# Patient Record
Sex: Female | Born: 1940 | Race: White | Hispanic: No | Marital: Married | Smoking: Never smoker
Health system: Southern US, Community
[De-identification: ages and names within clinical notes are randomized; demographics above are authoritative.]

## PROBLEM LIST (undated history)

## (undated) DIAGNOSIS — I4891 Unspecified atrial fibrillation: Secondary | ICD-10-CM

## (undated) DIAGNOSIS — E785 Hyperlipidemia, unspecified: Secondary | ICD-10-CM

## (undated) DIAGNOSIS — I2510 Atherosclerotic heart disease of native coronary artery without angina pectoris: Secondary | ICD-10-CM

## (undated) DIAGNOSIS — I10 Essential (primary) hypertension: Secondary | ICD-10-CM

## (undated) DIAGNOSIS — N183 Chronic kidney disease, stage 3 unspecified: Secondary | ICD-10-CM

## (undated) HISTORY — PX: COLONOSCOPY: SHX174

## (undated) HISTORY — PX: CORONARY STENT PLACEMENT: SHX1402

## (undated) HISTORY — PX: OTHER SURGICAL HISTORY: SHX169

---

## 2021-01-07 ENCOUNTER — Observation Stay
Admission: EM | Admit: 2021-01-07 | Discharge: 2021-01-08 | Disposition: A | Discharge status: Home / Self Care | Payer: Medicare Other | Attending: Internal Medicine | Admitting: Internal Medicine

## 2021-01-07 ENCOUNTER — Encounter: Payer: Self-pay | Admitting: Emergency Medicine

## 2021-01-07 ENCOUNTER — Encounter: Payer: Self-pay | Admitting: Internal Medicine

## 2021-01-07 ENCOUNTER — Emergency Department: Payer: Medicare Other

## 2021-01-07 DIAGNOSIS — I4821 Permanent atrial fibrillation: Secondary | ICD-10-CM | POA: Insufficient documentation

## 2021-01-07 DIAGNOSIS — Z7901 Long term (current) use of anticoagulants: Secondary | ICD-10-CM | POA: Diagnosis not present

## 2021-01-07 DIAGNOSIS — N1832 Chronic kidney disease, stage 3b: Secondary | ICD-10-CM | POA: Diagnosis not present

## 2021-01-07 DIAGNOSIS — I700 Atherosclerosis of aorta: Secondary | ICD-10-CM | POA: Insufficient documentation

## 2021-01-07 DIAGNOSIS — A419 Sepsis, unspecified organism: Principal | ICD-10-CM | POA: Diagnosis present

## 2021-01-07 DIAGNOSIS — K922 Gastrointestinal hemorrhage, unspecified: Secondary | ICD-10-CM

## 2021-01-07 DIAGNOSIS — K625 Hemorrhage of anus and rectum: Secondary | ICD-10-CM | POA: Diagnosis present

## 2021-01-07 DIAGNOSIS — Z79899 Other long term (current) drug therapy: Secondary | ICD-10-CM | POA: Insufficient documentation

## 2021-01-07 DIAGNOSIS — K529 Noninfective gastroenteritis and colitis, unspecified: Secondary | ICD-10-CM | POA: Diagnosis present

## 2021-01-07 DIAGNOSIS — I10 Essential (primary) hypertension: Secondary | ICD-10-CM | POA: Diagnosis present

## 2021-01-07 DIAGNOSIS — I2510 Atherosclerotic heart disease of native coronary artery without angina pectoris: Secondary | ICD-10-CM | POA: Diagnosis not present

## 2021-01-07 DIAGNOSIS — I129 Hypertensive chronic kidney disease with stage 1 through stage 4 chronic kidney disease, or unspecified chronic kidney disease: Secondary | ICD-10-CM | POA: Insufficient documentation

## 2021-01-07 DIAGNOSIS — I4891 Unspecified atrial fibrillation: Secondary | ICD-10-CM | POA: Diagnosis present

## 2021-01-07 DIAGNOSIS — E785 Hyperlipidemia, unspecified: Secondary | ICD-10-CM | POA: Diagnosis present

## 2021-01-07 DIAGNOSIS — Z20822 Contact with and (suspected) exposure to covid-19: Secondary | ICD-10-CM | POA: Insufficient documentation

## 2021-01-07 DIAGNOSIS — N183 Chronic kidney disease, stage 3 unspecified: Secondary | ICD-10-CM | POA: Diagnosis present

## 2021-01-07 HISTORY — DX: Chronic kidney disease, stage 3 unspecified: N18.30

## 2021-01-07 HISTORY — DX: Unspecified atrial fibrillation: I48.91

## 2021-01-07 HISTORY — DX: Essential (primary) hypertension: I10

## 2021-01-07 HISTORY — DX: Hyperlipidemia, unspecified: E78.5

## 2021-01-07 HISTORY — DX: Atherosclerotic heart disease of native coronary artery without angina pectoris: I25.10

## 2021-01-07 LAB — URINALYSIS, COMPLETE (UACMP) WITH MICROSCOPIC
Bilirubin Urine: NEGATIVE
Glucose, UA: NEGATIVE mg/dL
Ketones, ur: NEGATIVE mg/dL
Nitrite: NEGATIVE
Protein, ur: NEGATIVE mg/dL
Specific Gravity, Urine: 1.015 (ref 1.005–1.030)
pH: 5 (ref 5.0–8.0)

## 2021-01-07 LAB — CBC
HCT: 38.7 % (ref 36.0–46.0)
HCT: 38.7 % (ref 36.0–46.0)
HCT: 35.7 % — ABNORMAL LOW (ref 36.0–46.0)
HCT: 33.9 % — ABNORMAL LOW (ref 36.0–46.0)
Hemoglobin: 13.2 g/dL (ref 12.0–15.0)
Hemoglobin: 13.1 g/dL (ref 12.0–15.0)
Hemoglobin: 12 g/dL (ref 12.0–15.0)
Hemoglobin: 11.3 g/dL — ABNORMAL LOW (ref 12.0–15.0)
MCH: 32.6 pg (ref 26.0–34.0)
MCH: 32.4 pg (ref 26.0–34.0)
MCH: 32.3 pg (ref 26.0–34.0)
MCH: 32.4 pg (ref 26.0–34.0)
MCHC: 34.1 g/dL (ref 30.0–36.0)
MCHC: 33.9 g/dL (ref 30.0–36.0)
MCHC: 33.6 g/dL (ref 30.0–36.0)
MCHC: 33.3 g/dL (ref 30.0–36.0)
MCV: 95.6 fL (ref 80.0–100.0)
MCV: 95.8 fL (ref 80.0–100.0)
MCV: 96.2 fL (ref 80.0–100.0)
MCV: 97.1 fL (ref 80.0–100.0)
Platelets: 244 10*3/uL (ref 150–400)
Platelets: 261 10*3/uL (ref 150–400)
Platelets: 246 10*3/uL (ref 150–400)
Platelets: 208 10*3/uL (ref 150–400)
RBC: 4.05 MIL/uL (ref 3.87–5.11)
RBC: 4.04 MIL/uL (ref 3.87–5.11)
RBC: 3.71 MIL/uL — ABNORMAL LOW (ref 3.87–5.11)
RBC: 3.49 MIL/uL — ABNORMAL LOW (ref 3.87–5.11)
RDW: 13.1 % (ref 11.5–15.5)
RDW: 13.2 % (ref 11.5–15.5)
RDW: 13.1 % (ref 11.5–15.5)
RDW: 13.2 % (ref 11.5–15.5)
WBC: 16.6 10*3/uL — ABNORMAL HIGH (ref 4.0–10.5)
WBC: 17.6 10*3/uL — ABNORMAL HIGH (ref 4.0–10.5)
WBC: 13.8 10*3/uL — ABNORMAL HIGH (ref 4.0–10.5)
WBC: 12.1 10*3/uL — ABNORMAL HIGH (ref 4.0–10.5)
nRBC: 0 % (ref 0.0–0.2)
nRBC: 0 % (ref 0.0–0.2)
nRBC: 0 % (ref 0.0–0.2)
nRBC: 0 % (ref 0.0–0.2)

## 2021-01-07 LAB — COMPREHENSIVE METABOLIC PANEL
ALT: 13 U/L (ref 0–44)
AST: 25 U/L (ref 15–41)
Albumin: 4.1 g/dL (ref 3.5–5.0)
Alkaline Phosphatase: 82 U/L (ref 38–126)
Anion gap: 7 (ref 5–15)
BUN: 36 mg/dL — ABNORMAL HIGH (ref 8–23)
CO2: 23 mmol/L (ref 22–32)
Calcium: 9.1 mg/dL (ref 8.9–10.3)
Chloride: 107 mmol/L (ref 98–111)
Creatinine, Ser: 1.62 mg/dL — ABNORMAL HIGH (ref 0.44–1.00)
GFR, Estimated: 32 mL/min — ABNORMAL LOW (ref >60)
Glucose, Bld: 152 mg/dL — ABNORMAL HIGH (ref 70–99)
Potassium: 4.4 mmol/L (ref 3.5–5.1)
Sodium: 137 mmol/L (ref 135–145)
Total Bilirubin: 0.8 mg/dL (ref 0.3–1.2)
Total Protein: 7.1 g/dL (ref 6.5–8.1)

## 2021-01-07 LAB — RESP PANEL BY RT-PCR (FLU A&B, COVID) ARPGX2
Influenza A by PCR: NEGATIVE
Influenza B by PCR: NEGATIVE
SARS Coronavirus 2 by RT PCR: NEGATIVE

## 2021-01-07 LAB — PROCALCITONIN: Procalcitonin: 0.17 ng/mL

## 2021-01-07 LAB — BRAIN NATRIURETIC PEPTIDE: B Natriuretic Peptide: 154.1 pg/mL — ABNORMAL HIGH (ref 0.0–100.0)

## 2021-01-07 LAB — LIPASE, BLOOD: Lipase: 34 U/L (ref 11–51)

## 2021-01-07 LAB — PROTIME-INR
INR: 1.6 — ABNORMAL HIGH (ref 0.8–1.2)
Prothrombin Time: 19 seconds — ABNORMAL HIGH (ref 11.4–15.2)

## 2021-01-07 LAB — TYPE AND SCREEN
ABO/RH(D): A POS
Antibody Screen: NEGATIVE

## 2021-01-07 LAB — LACTIC ACID, PLASMA: Lactic Acid, Venous: 1.8 mmol/L (ref 0.5–1.9)

## 2021-01-07 LAB — APTT: aPTT: 29 seconds (ref 24–36)

## 2021-01-07 MED ORDER — DILTIAZEM HCL ER COATED BEADS 120 MG PO CP24
360.0000 mg | ORAL_CAPSULE | Freq: Every day | ORAL | Status: DC
  Administered 2021-01-07 – 2021-01-08 (×2): 360 mg via ORAL
  Filled 2021-01-07 (×2): qty 3

## 2021-01-07 MED ORDER — RISAQUAD PO CAPS
1.0000 | ORAL_CAPSULE | Freq: Every day | ORAL | Status: DC
  Administered 2021-01-07 – 2021-01-08 (×2): 1 via ORAL
  Filled 2021-01-07 (×2): qty 1

## 2021-01-07 MED ORDER — SODIUM CHLORIDE 0.9 % IV SOLN: 2.0000 g | Freq: Once | INTRAVENOUS | Status: DC

## 2021-01-07 MED ORDER — HYDRALAZINE HCL 20 MG/ML IJ SOLN: 5.0000 mg | INTRAMUSCULAR | Status: DC | PRN

## 2021-01-07 MED ORDER — ONDANSETRON HCL 4 MG/2ML IJ SOLN
4.0000 mg | Freq: Three times a day (TID) | INTRAMUSCULAR | Status: DC | PRN
  Administered 2021-01-07: 4 mg via INTRAVENOUS
  Filled 2021-01-07: qty 2

## 2021-01-07 MED ORDER — ACETAMINOPHEN 325 MG PO TABS: 650.0000 mg | ORAL_TABLET | Freq: Four times a day (QID) | ORAL | Status: DC | PRN

## 2021-01-07 MED ORDER — METOPROLOL SUCCINATE ER 25 MG PO TB24: 25.0000 mg | ORAL_TABLET | Freq: Every evening | ORAL | Status: DC

## 2021-01-07 MED ORDER — VITAMIN D 25 MCG (1000 UNIT) PO TABS
1000.0000 [IU] | ORAL_TABLET | Freq: Every day | ORAL | Status: DC
  Administered 2021-01-07 – 2021-01-08 (×2): 1000 [IU] via ORAL
  Filled 2021-01-07 (×2): qty 1

## 2021-01-07 MED ORDER — SODIUM CHLORIDE 0.9 % IV SOLN: INTRAVENOUS | Status: DC

## 2021-01-07 MED ORDER — PIPERACILLIN-TAZOBACTAM 3.375 G IVPB
3.3750 g | Freq: Three times a day (TID) | INTRAVENOUS | Status: DC
  Administered 2021-01-07 – 2021-01-08 (×3): 3.375 g via INTRAVENOUS
  Filled 2021-01-07 (×3): qty 50

## 2021-01-07 MED ORDER — MORPHINE SULFATE (PF) 2 MG/ML IV SOLN: 0.5000 mg | INTRAVENOUS | Status: DC | PRN

## 2021-01-07 MED ORDER — METRONIDAZOLE 500 MG/100ML IV SOLN: 500.0000 mg | Freq: Once | INTRAVENOUS | Status: DC

## 2021-01-07 MED ORDER — ATORVASTATIN CALCIUM 20 MG PO TABS
20.0000 mg | ORAL_TABLET | Freq: Every day | ORAL | Status: DC
  Administered 2021-01-07 – 2021-01-08 (×2): 20 mg via ORAL
  Filled 2021-01-07 (×2): qty 1

## 2021-01-07 MED ORDER — SODIUM CHLORIDE 0.9 % IV BOLUS
1000.0000 mL | Freq: Once | INTRAVENOUS | Status: AC
  Administered 2021-01-07: 1000 mL via INTRAVENOUS

## 2021-01-07 NOTE — Consult Note (Signed)
Pharmacy Antibiotic Note  Raven Cox is a 80 y.o. female admitted on 01/07/2021 with  acute colitis and sepsis  presenting to the ED with abdominal pain and rectal bleeding.  Pharmacy has been consulted for Zosyn dosing.  Plan: Zosyn 3.375g IV q8h (4 hour infusion).  Height: 5' 5.98" (167.6 cm) Weight: 95.3 kg (210 lb) IBW/kg (Calculated) : 59.26  Temp (24hrs), Avg:98.3 F (36.8 C), Min:98.3 F (36.8 C), Max:98.3 F (36.8 C)  Recent Labs  Lab 01/07/21 0458 01/07/21 0832 01/07/21 0941  WBC 16.6* 17.6*  --   CREATININE 1.62*  --   --   LATICACIDVEN  --   --  1.8    Estimated Creatinine Clearance: 31.2 mL/min (A) (by C-G formula based on SCr of 1.62 mg/dL (H)).    Not on File  Antimicrobials this admission: Zosyn 7/29 >>   Microbiology results: 7/29 BCx: pending   Thank you for allowing pharmacy to be a part of this patient's care.  Gerasimos Plotts A Mirriam Vadala 01/07/2021 11:10 AM

## 2021-01-07 NOTE — H&P (Signed)
History and Physical    Raven Cox PXT:[id number] DOB: 10-07-1940 DOA: 01/07/2021  Referring MD/NP/PA:   PCP: Pcp, No   Patient coming from:  The patient is coming from home.  At baseline, pt is independent for most of ADL.        Chief Complaint: Rectal bleeding, nausea, vomiting, abdominal pain  HPI: Raven Cox is a 80 y.o. female with medical history significant of hypertension, hyperlipidemia, atrial fibrillation on Eliquis, CKD stage IIIa, CAD, stent placement, who presents with rectal bleeding, nausea, vomiting, abdominal pain.  Patient is visiting from [place].  She states that she developed abdominal pain since last night.  Associated with nausea, vomiting and rectal bleeding.  She has had multiple episodes of bright red rectal bleeding.  She has had 2 times of nonbilious nonbloody vomiting.  No fever or chills.  Abdominal pain is located in lower abdomen, mild, crampy-like, nonradiating.  Patient does not have chest pain, cough, shortness breath.  No symptoms of UTI.  Patient is taking Eliquis for A. fib, last dose was yesterday morning.   ED Course: pt was found to have hemoglobin 13.2, WBC 16.6, BNP 154, lipase 34, negative COVID PCR, lactic acid 1.6, urinalysis (clear appearance, trace amount of leukocyte, rare bacteria, WBC 0-5), renal function close to baseline. Temperature normal, blood pressure 135/90, heart rate of 104, RR 18, oxygen saturation 96% on room air.  CT abdomen/pelvis showed descending colitis without evidence of abscess or perforation.  Patient is admitted to MedSurg bed as inpatient.  Dr. Tobi Bastos of GI is consulted.  Review of Systems:   General: no fevers, chills, no body weight gain, has poor appetite. HEENT: no blurry vision, hearing changes or sore throat Respiratory: no dyspnea, coughing, wheezing CV: no chest pain, no palpitations GI: has nausea, vomiting, abdominal pain, rectal bleeding. No constipation GU: no dysuria, burning on urination,  increased urinary frequency, hematuria  Ext: no leg edema Neuro: no unilateral weakness, numbness, or tingling, no vision change or hearing loss Skin: no rash, no skin tear. MSK: No muscle spasm, no deformity, no limitation of range of movement in spin Heme: No easy bruising.  Travel history: No recent long distant travel.  Allergy: Not on File  Past Medical History:  Diagnosis Date   Afib (HCC)    CAD (coronary artery disease)    CKD (chronic kidney disease), stage III (HCC)    HLD (hyperlipidemia)    Hypertension     Past Surgical History:  Procedure Laterality Date   Cataract surgery     COLONOSCOPY     CORONARY STENT PLACEMENT      Social History:  reports that she has never smoked. She has never used smokeless tobacco. She reports current alcohol use of about 1.0 standard drink of alcohol per week. She reports that she does not use drugs.  Family History:  Family History  Problem Relation Age of Onset   Atrial fibrillation Mother    Heart disease Father    Bone cancer Brother      Prior to Admission medications   Not on File    Physical Exam: Vitals:   01/07/21 0447 01/07/21 0448 01/07/21 0449  BP:  135/90   Pulse:  (!) 104   Resp:  18   Temp:  98.3 F (36.8 C)   SpO2: 97% 96%   Weight:   95.3 kg  Height:   5' 5.98" (1.676 m)   General: Not in acute distress HEENT:  Eyes: PERRL, EOMI, no scleral icterus.       ENT: No discharge from the ears and nose, no pharynx injection, no tonsillar enlargement.        Neck: No JVD, no bruit, no mass felt. Heme: No neck lymph node enlargement. Cardiac: S1/S2, RRR, No murmurs, No gallops or rubs. Respiratory: No rales, wheezing, rhonchi or rubs. GI: Soft, nondistended, has tenderness in the lower abdomen, no rebound pain, no organomegaly, BS present. GU: No hematuria Ext: No pitting leg edema bilaterally. 1+DP/PT pulse bilaterally. Musculoskeletal: No joint deformities, No joint redness or warmth, no  limitation of ROM in spin. Skin: No rashes.  Neuro: Alert, oriented X3, cranial nerves II-XII grossly intact, moves all extremities normally. Psych: Patient is not psychotic, no suicidal or hemocidal ideation.  Labs on Admission: I have personally reviewed following labs and imaging studies  CBC: Recent Labs  Lab 01/07/21 0458 01/07/21 0832  WBC 16.6* 17.6*  HGB 13.2 13.1  HCT 38.7 38.7  MCV 95.6 95.8  PLT 244 261   Basic Metabolic Panel: Recent Labs  Lab 01/07/21 0458  NA 137  K 4.4  CL 107  CO2 23  GLUCOSE 152*  BUN 36*  CREATININE 1.62*  CALCIUM 9.1   GFR: Estimated Creatinine Clearance: 31.2 mL/min (A) (by C-G formula based on SCr of 1.62 mg/dL (H)). Liver Function Tests: Recent Labs  Lab 01/07/21 0458  AST 25  ALT 13  ALKPHOS 82  BILITOT 0.8  PROT 7.1  ALBUMIN 4.1   Recent Labs  Lab 01/07/21 0458  LIPASE 34   No results for input(s): AMMONIA in the last 168 hours. Coagulation Profile: Recent Labs  Lab 01/07/21 0458  INR 1.6*   Cardiac Enzymes: No results for input(s): CKTOTAL, CKMB, CKMBINDEX, TROPONINI in the last 168 hours. BNP (last 3 results) No results for input(s): PROBNP in the last 8760 hours. HbA1C: No results for input(s): HGBA1C in the last 72 hours. CBG: No results for input(s): GLUCAP in the last 168 hours. Lipid Profile: No results for input(s): CHOL, HDL, LDLCALC, TRIG, CHOLHDL, LDLDIRECT in the last 72 hours. Thyroid Function Tests: No results for input(s): TSH, T4TOTAL, FREET4, T3FREE, THYROIDAB in the last 72 hours. Anemia Panel: No results for input(s): VITAMINB12, FOLATE, FERRITIN, TIBC, IRON, RETICCTPCT in the last 72 hours. Urine analysis:    Component Value Date/Time   COLORURINE YELLOW (A) 01/07/2021 0458   APPEARANCEUR CLEAR (A) 01/07/2021 0458   LABSPEC 1.015 01/07/2021 0458   PHURINE 5.0 01/07/2021 0458   GLUCOSEU NEGATIVE 01/07/2021 0458   HGBUR SMALL (A) 01/07/2021 0458   BILIRUBINUR NEGATIVE 01/07/2021  0458   KETONESUR NEGATIVE 01/07/2021 0458   PROTEINUR NEGATIVE 01/07/2021 0458   NITRITE NEGATIVE 01/07/2021 0458   LEUKOCYTESUR TRACE (A) 01/07/2021 0458   Sepsis Labs: @LABRCNTIP (procalcitonin:4,lacticidven:4) ) Recent Results (from the past 240 hour(s))  Resp Panel by RT-PCR (Flu A&B, Covid) Nasopharyngeal Swab     Status: None   Collection Time: 01/07/21  8:32 AM   Specimen: Nasopharyngeal Swab; Nasopharyngeal(NP) swabs in vial transport medium  Result Value Ref Range Status   SARS Coronavirus 2 by RT PCR NEGATIVE NEGATIVE Final    Comment: (NOTE) SARS-CoV-2 target nucleic acids are NOT DETECTED.  The SARS-CoV-2 RNA is generally detectable in upper respiratory specimens during the acute phase of infection. The lowest concentration of SARS-CoV-2 viral copies this assay can detect is 138 copies/mL. A negative result does not preclude SARS-Cov-2 infection and should not be used as the sole basis  for treatment or other patient management decisions. A negative result may occur with  improper specimen collection/handling, submission of specimen other than nasopharyngeal swab, presence of viral mutation(s) within the areas targeted by this assay, and inadequate number of viral copies(<138 copies/mL). A negative result must be combined with clinical observations, patient history, and epidemiological information. The expected result is Negative.  Fact Sheet for Patients:  BloggerCourse.com  Fact Sheet for Healthcare Providers:  SeriousBroker.it  This test is no t yet approved or cleared by the [place] FDA and  has been authorized for detection and/or diagnosis of SARS-CoV-2 by FDA under an Emergency Use Authorization (EUA). This EUA will remain  in effect (meaning this test can be used) for the duration of the COVID-19 declaration under Section 564(b)(1) of the Act, 21 U.S.C.section 360bbb-3(b)(1), unless the authorization is  terminated  or revoked sooner.       Influenza A by PCR NEGATIVE NEGATIVE Final   Influenza B by PCR NEGATIVE NEGATIVE Final    Comment: (NOTE) The Xpert Xpress SARS-CoV-2/FLU/RSV plus assay is intended as an aid in the diagnosis of influenza from Nasopharyngeal swab specimens and should not be used as a sole basis for treatment. Nasal washings and aspirates are unacceptable for Xpert Xpress SARS-CoV-2/FLU/RSV testing.  Fact Sheet for Patients: BloggerCourse.com  Fact Sheet for Healthcare Providers: SeriousBroker.it  This test is not yet approved or cleared by the [place] FDA and has been authorized for detection and/or diagnosis of SARS-CoV-2 by FDA under an Emergency Use Authorization (EUA). This EUA will remain in effect (meaning this test can be used) for the duration of the COVID-19 declaration under Section 564(b)(1) of the Act, 21 U.S.C. section 360bbb-3(b)(1), unless the authorization is terminated or revoked.  Performed at [facility], [address] [place], [place] [address]      Radiological Exams on Admission: CT ABDOMEN PELVIS WO CONTRAST  Result Date: 01/07/2021 CLINICAL DATA:  Abdominal distension, right red blood per rectum, on Eliquis EXAM: CT ABDOMEN AND PELVIS WITHOUT CONTRAST TECHNIQUE: Multidetector CT imaging of the abdomen and pelvis was performed following the standard protocol without IV contrast. COMPARISON:  None. FINDINGS: Lower chest: Coronary calcifications. No pleural or pericardial effusion. Hepatobiliary: No focal liver abnormality is seen. No gallstones, gallbladder wall thickening, or biliary dilatation. Pancreas: Unremarkable. No pancreatic ductal dilatation or surrounding inflammatory changes. Spleen: Normal in size without focal abnormality. Adrenals/Urinary Tract: Adrenal glands are unremarkable. Kidneys are normal, without renal calculi, focal lesion, or hydronephrosis.  Bladder is unremarkable. Stomach/Bowel: Stomach decompressed. Small bowel is nondistended. Normal appendix. Colon is nondilated. There are mild edematous/inflammatory changes around the descending colon. No significant diverticular disease. No abscess. Vascular/Lymphatic: Calcified aortoiliac plaque without aneurysm. No abdominal or pelvic adenopathy. Reproductive: Status post hysterectomy. No adnexal masses. Other: No ascites.  No free air. Musculoskeletal: Multilevel lumbar spondylitic change. No fracture or worrisome bone lesion. IMPRESSION: 1. Mild inflammatory/edematous changes around the descending colon suggesting colitis. No evidence of perforation or abscess. 2. Coronary and aortoiliac  atherosclerosis (ICD10-170.0). Electronically Signed   By: Corlis Leak M.D.   On: 01/07/2021 08:28     EKG: I have personally reviewed.  A fib, QTC 455, no ischemic change  Assessment/Plan Principal Problem:   Acute colitis Active Problems:   Sepsis (HCC)   Afib (HCC)   Hypertension   CKD (chronic kidney disease), stage IIIb   HLD (hyperlipidemia)   CAD (coronary artery disease)   Sepsis due to acute colitis: CT abdomen/pelvis showed descending  colitis without evidence of abscess or perforation.  Patient meets criteria for sepsis with WBC 16.6, tachycardia with heart rate of 104.  Lactic acid is normal.  Hgb 13.2 --> 12.0. Currently hemodynamically stable.  Due to bloody diarrhea.  Check C. Difficile.  Dr. Tobi BastosAnna of GI is consulted.  -will admit MedSurg bed as inpatient -IV Zosyn -As needed Zofran and morphine -check CBC q6h -blood culture -will get Procalcitonin and trend lactic acid levels per sepsis protocol. -IVF: 1L of NS bolus in ED, followed by 75 cc/h  -Hold Plavix and Eliquis  Afib (HCC) -Hold Eliquis -Continue Cardizem and metoprolol  Hypertension -IV hydralazine as needed -Continue Cardizem and metoprolol -Hold lisinopril since patient is at risk of developing hypotension due to  sepsis  CKD (chronic kidney disease), stage IIIb: Baseline creatinine 1.76 on 10/28/2019.  Her creatinine is 1.62, BUN 36.  Stable -Follow-up with BMP  HLD (hyperlipidemia) -Lipitor  CAD (coronary artery disease): S/p of stent placement.  No chest pain -Lipitor -Hold Plavix due to GI bleeding     DVT ppx: SCD Code Status: Full code Family Communication: not done, no family member is at bed side.  Disposition Plan:  Anticipate discharge back to previous environment Consults called:  Dr. Tobi BastosAnna of GI Admission status and Level of care: Med-Surg:   as inpt          Status is: Inpatient  Remains inpatient appropriate because:Inpatient level of care appropriate due to severity of illness  Dispo: The patient is from: Home              Anticipated d/c is to: Home              Patient currently is not medically stable to d/c.   Difficult to place patient No            Date of Service 01/07/2021    Lorretta HarpXilin Yulisa Chirico [facility]   If 7PM-7AM, please contact night-coverage [email or web address] 01/07/2021, 1:13 PM

## 2021-01-07 NOTE — ED Provider Notes (Signed)
[facility] Emergency Department Provider Note  ____________________________________________   Event Date/Time   First MD Initiated Contact with Patient 01/07/21 0719     (approximate)  I have reviewed the triage vital signs and the nursing notes.   HISTORY  Chief Complaint Abdominal Pain and Rectal Bleeding    HPI Raven Cox is a 80 y.o. female  with h/o AFib on Eliquis, HTN here with abdominal pain, rectal bleeding. Pt reports that she had ribs, potato salad last night. Shortly thereafter, she developed mild abd discomfort, nausea, and emesis x 1. She then had several loose BM that were initially nonbloody but turned grossly bloody. Since then, she's had recurrent bloody stools, with bright red blood. Reports she has had some associated aching, gnawing, cramping lower abd pain with this. No alleviating factors. No fevers, chills. Has had no CP, lightheadedness, or dizziness. She last took her ELiquis yesterday - has not taken anything since then. No fever, chills. She is currently visiting from [place].       Past Medical History:  Diagnosis Date   Afib (HCC)    Hypertension     There are no problems to display for this patient.   History reviewed. No pertinent surgical history.  Prior to Admission medications   Not on File    Allergies Patient has no allergy information on record.  No family history on file.  Social History Social History   Tobacco Use   Smoking status: Never   Smokeless tobacco: Never  Substance Use Topics   Alcohol use: Yes    Alcohol/week: 1.0 standard drink    Types: 1 Glasses of wine per week   Drug use: Never    Review of Systems  Review of Systems  Constitutional:  Positive for fatigue. Negative for fever.  HENT:  Negative for congestion and sore throat.   Eyes:  Negative for visual disturbance.  Respiratory:  Negative for cough and shortness of breath.   Cardiovascular:  Negative for chest pain.   Gastrointestinal:  Positive for abdominal pain and blood in stool. Negative for diarrhea, nausea and vomiting.  Genitourinary:  Negative for flank pain.  Musculoskeletal:  Negative for back pain and neck pain.  Skin:  Negative for rash and wound.  Neurological:  Negative for weakness.  All other systems reviewed and are negative.   ____________________________________________  PHYSICAL EXAM:      VITAL SIGNS: ED Triage Vitals  Enc Vitals Group     BP 01/07/21 0448 135/90     Pulse Rate 01/07/21 0448 (!) 104     Resp 01/07/21 0448 18     Temp 01/07/21 0448 98.3 F (36.8 C)     Temp src --      SpO2 01/07/21 0447 97 %     Weight 01/07/21 0449 210 lb (95.3 kg)     Height --      Head Circumference --      Peak Flow --      Pain Score --      Pain Loc --      Pain Edu? --      Excl. in GC? --      Physical Exam Vitals and nursing note reviewed.  Constitutional:      General: She is not in acute distress.    Appearance: She is well-developed.  HENT:     Head: Normocephalic and atraumatic.  Eyes:     Conjunctiva/sclera: Conjunctivae normal.  Cardiovascular:  Rate and Rhythm: Normal rate and regular rhythm.     Heart sounds: Normal heart sounds. No murmur heard.   No friction rub.  Pulmonary:     Effort: Pulmonary effort is normal. No respiratory distress.     Breath sounds: Normal breath sounds. No wheezing or rales.  Abdominal:     General: There is no distension.     Palpations: Abdomen is soft.     Tenderness: There is abdominal tenderness in the left lower quadrant.  Musculoskeletal:     Cervical back: Neck supple.  Skin:    General: Skin is warm.     Capillary Refill: Capillary refill takes less than 2 seconds.  Neurological:     Mental Status: She is alert and oriented to person, place, and time.     Motor: No abnormal muscle tone.      ____________________________________________   LABS (all labs ordered are listed, but only abnormal results are  displayed)  Labs Reviewed  COMPREHENSIVE METABOLIC PANEL - Abnormal; Notable for the following components:      Result Value   Glucose, Bld 152 (*)    BUN 36 (*)    Creatinine, Ser 1.62 (*)    GFR, Estimated 32 (*)    All other components within normal limits  CBC - Abnormal; Notable for the following components:   WBC 16.6 (*)    All other components within normal limits  URINALYSIS, COMPLETE (UACMP) WITH MICROSCOPIC - Abnormal; Notable for the following components:   Color, Urine YELLOW (*)    APPearance CLEAR (*)    Hgb urine dipstick SMALL (*)    Leukocytes,Ua TRACE (*)    Bacteria, UA RARE (*)    All other components within normal limits  CULTURE, BLOOD (SINGLE)  RESP PANEL BY RT-PCR (FLU A&B, COVID) [id number]  CULTURE, BLOOD (SINGLE)  LIPASE, BLOOD  CBC  TYPE AND SCREEN    ____________________________________________  EKG: Atrial fibrillation, VR 91. QRS 93, QTc 456. No acute St elevations or depressions. No ischemia or infarct. ________________________________________  RADIOLOGY All imaging, including plain films, CT scans, and ultrasounds, independently reviewed by me, and interpretations confirmed via formal radiology reads.  ED MD interpretation:   CT A/P: Mild inflammation around descending colon c/w colitis  Official radiology report(s): CT ABDOMEN PELVIS WO CONTRAST  Result Date: 01/07/2021 CLINICAL DATA:  Abdominal distension, right red blood per rectum, on Eliquis EXAM: CT ABDOMEN AND PELVIS WITHOUT CONTRAST TECHNIQUE: Multidetector CT imaging of the abdomen and pelvis was performed following the standard protocol without IV contrast. COMPARISON:  None. FINDINGS: Lower chest: Coronary calcifications. No pleural or pericardial effusion. Hepatobiliary: No focal liver abnormality is seen. No gallstones, gallbladder wall thickening, or biliary dilatation. Pancreas: Unremarkable. No pancreatic ductal dilatation or surrounding inflammatory changes. Spleen: Normal in  size without focal abnormality. Adrenals/Urinary Tract: Adrenal glands are unremarkable. Kidneys are normal, without renal calculi, focal lesion, or hydronephrosis. Bladder is unremarkable. Stomach/Bowel: Stomach decompressed. Small bowel is nondistended. Normal appendix. Colon is nondilated. There are mild edematous/inflammatory changes around the descending colon. No significant diverticular disease. No abscess. Vascular/Lymphatic: Calcified aortoiliac plaque without aneurysm. No abdominal or pelvic adenopathy. Reproductive: Status post hysterectomy. No adnexal masses. Other: No ascites.  No free air. Musculoskeletal: Multilevel lumbar spondylitic change. No fracture or worrisome bone lesion. IMPRESSION: 1. Mild inflammatory/edematous changes around the descending colon suggesting colitis. No evidence of perforation or abscess. 2. Coronary and aortoiliac  atherosclerosis (ICD10-170.0). Electronically Signed   By: Ronald Pippins.D.  On: 01/07/2021 08:28    ____________________________________________  PROCEDURES   Procedure(s) performed (including Critical Care):  Procedures  ____________________________________________  INITIAL IMPRESSION / MDM / ASSESSMENT AND PLAN / ED COURSE  As part of my medical decision making, I reviewed the following data within the electronic MEDICAL RECORD NUMBER Nursing notes reviewed and incorporated, Old chart reviewed, Notes from prior ED visits, and [place] Controlled Substance Database       *Bridgett Hattabaugh was evaluated in Emergency Department on 01/07/2021 for the symptoms described in the history of present illness. She was evaluated in the context of the global COVID-19 pandemic, which necessitated consideration that the patient might be at risk for infection with the SARS-CoV-2 virus that causes COVID-19. Institutional protocols and algorithms that pertain to the evaluation of patients at risk for COVID-19 are in a state of rapid change based on information released  by regulatory bodies including the CDC and federal and state organizations. These policies and algorithms were followed during the patient's care in the ED.  Some ED evaluations and interventions may be delayed as a result of limited staffing during the pandemic.*     Medical Decision Making:  80 yo F with h/o AFib on eliquis here with rectal bleeding. Suspect diverticular bleed, ddx includes diverticulitis, AVM, less likely UGIB. Pt is hemodynamically stable at this time. Last eliquis dose was approx 24 hours ago. Will recheck CBC to trend Hgb, check CT A/P and reassess.   CT A/P shows colitis. Labs with leukocytosis, normal Hgb. CMP with mild dehydration, slight BUN/CR elevation. Denies recent abx use or c. Diff risk factors. Will admit for fluids, abx, monitoring of hgb.  ____________________________________________  FINAL CLINICAL IMPRESSION(S) / ED DIAGNOSES  Final diagnoses:  Gastrointestinal hemorrhage, unspecified gastrointestinal hemorrhage type  Colitis     MEDICATIONS GIVEN DURING THIS VISIT:  Medications  sodium chloride 0.9 % bolus 1,000 mL (has no administration in time range)  cefTRIAXone (ROCEPHIN) 2 g in sodium chloride 0.9 % 100 mL IVPB (has no administration in time range)  metroNIDAZOLE (FLAGYL) IVPB 500 mg (has no administration in time range)     ED Discharge Orders     None        Note:  This document was prepared using Dragon voice recognition software and may include unintentional dictation errors.   Shaune Pollack, MD 01/07/21 [phone]

## 2021-01-07 NOTE — ED Notes (Signed)
Informed RN bed assigned 

## 2021-01-07 NOTE — Consult Note (Signed)
[name] , MD [address], [place], [place], [address] [address], [address], [place], [place], [address] Phone: [phone]  Fax: [phone]  Consultation  Referring Provider:    Dr Clyde Lundborg Primary Care Physician:  Pcp, No Primary Gastroenterologist: None          Reason for Consultation:     Colitis   Date of Admission:  01/07/2021 Date of Consultation:  01/07/2021         HPI:   Raven Cox is a 80 y.o. female presented to the ER with rectal bleeding nausea vomiting and abdominal pain.  The patient is on Eliquis for atrial fibrillation and also has history of CKD CAD.  She is from [place] and was visiting and she developed acute onset abdominal pain since last night  In the ER hemoglobin was 13.2 g white cell count of 16.6 CT abdomen and pelvis showed that she had inflammation of the colon in the descending colon suggestive of colitis.  She says she was doing well , came to [facility] for a reunion , had some pork ribs and potato salad day before yesterday and a few hours later had to use to the restroom , felt constipated, didn't have a bowel movement but had multiple episodes of nausea/vomiting and subsequently bloody diarrhea. No fever , no NSAID use,She is on Eloquis. Presently no abdominal pain or nausea or vomiting . She is still having bloody bowel movements but of smaller qty.   Past Medical History:  Diagnosis Date  . Afib (HCC)   . CAD (coronary artery disease)   . CKD (chronic kidney disease), stage III (HCC)   . HLD (hyperlipidemia)   . Hypertension     Past Surgical History:  Procedure Laterality Date  . Cataract surgery    . COLONOSCOPY    . CORONARY STENT PLACEMENT      Prior to Admission medications   Medication Sig Start Date End Date Taking? Authorizing Provider  acidophilus (RISAQUAD) CAPS capsule Take 1 capsule by mouth daily.   Yes [provider]  apixaban (ELIQUIS) 5 MG TABS tablet Take 5 mg by mouth 2 (two) times daily.   Yes  [provider]  atorvastatin (LIPITOR) 20 MG tablet Take 20 mg by mouth daily.   Yes [provider]  cholecalciferol (VITAMIN D3) 25 MCG (1000 UNIT) tablet Take 1,000 Units by mouth daily.   Yes [provider]  clopidogrel (PLAVIX) 75 MG tablet Take 75 mg by mouth daily.   Yes [provider]  diltiazem (CARDIZEM CD) 180 MG 24 hr capsule Take 360 mg by mouth daily.   Yes [provider]  lisinopril (ZESTRIL) 20 MG tablet Take 20 mg by mouth every evening.   Yes [provider]  metoprolol succinate (TOPROL-XL) 25 MG 24 hr tablet Take 25 mg by mouth every evening.   Yes [provider]    Family History  Problem Relation Age of Onset  . Atrial fibrillation Mother   . Heart disease Father   . Bone cancer Brother      Social History   Tobacco Use  . Smoking status: Never  . Smokeless tobacco: Never  Substance Use Topics  . Alcohol use: Yes    Alcohol/week: 1.0 standard drink    Types: 1 Glasses of wine per week  . Drug use: Never    Allergies as of 01/07/2021  . (Not on File)    Review of Systems:  All systems reviewed and negative except where noted in HPI.   Physical Exam:  Vital signs in last 24 hours: Temp:  [98.3 F (36.8 C)] 98.3 F (36.8 C) (07/29 0448) Pulse Rate:  [104] 104 (07/29 0448) Resp:  [18] 18 (07/29 0448) BP: (135)/(90) 135/90 (07/29 0448) SpO2:  [96 %-97 %] 96 % (07/29 0448) Weight:  [95.3 kg] 95.3 kg (07/29 0449)   General:   Pleasant, cooperative in NAD Head:  Normocephalic and atraumatic. Eyes:   No icterus.   Conjunctiva pink. PERRLA. Ears:  Normal auditory acuity. Neck:  Supple; no masses or thyroidomegaly Lungs: Respirations even and unlabored. Lungs clear to auscultation bilaterally.   No wheezes, crackles, or rhonchi.  Heart:  Regular rate and rhythm;  Without murmur, clicks, rubs or gallops Abdomen:  Soft, nondistended, nontender. Normal bowel sounds. No appreciable masses  or hepatomegaly.  No rebound or guarding.  Neurologic:  Alert and oriented x3;  grossly normal neurologically. Skin:  Intact without significant lesions or rashes. Cervical Nodes:  No significant cervical adenopathy. Psych:  Alert and cooperative. Normal affect.  LAB RESULTS: Recent Labs    01/07/21 0458 01/07/21 0832  WBC 16.6* 17.6*  HGB 13.2 13.1  HCT 38.7 38.7  PLT 244 261   BMET Recent Labs    01/07/21 0458  NA 137  K 4.4  CL 107  CO2 23  GLUCOSE 152*  BUN 36*  CREATININE 1.62*  CALCIUM 9.1   LFT Recent Labs    01/07/21 0458  PROT 7.1  ALBUMIN 4.1  AST 25  ALT 13  ALKPHOS 82  BILITOT 0.8   PT/INR Recent Labs    01/07/21 0458  LABPROT 19.0*  INR 1.6*    STUDIES: CT ABDOMEN PELVIS WO CONTRAST  Result Date: 01/07/2021 CLINICAL DATA:  Abdominal distension, right red blood per rectum, on Eliquis EXAM: CT ABDOMEN AND PELVIS WITHOUT CONTRAST TECHNIQUE: Multidetector CT imaging of the abdomen and pelvis was performed following the standard protocol without IV contrast. COMPARISON:  None. FINDINGS: Lower chest: Coronary calcifications. No pleural or pericardial effusion. Hepatobiliary: No focal liver abnormality is seen. No gallstones, gallbladder wall thickening, or biliary dilatation. Pancreas: Unremarkable. No pancreatic ductal dilatation or surrounding inflammatory changes. Spleen: Normal in size without focal abnormality. Adrenals/Urinary Tract: Adrenal glands are unremarkable. Kidneys are normal, without renal calculi, focal lesion, or hydronephrosis. Bladder is unremarkable. Stomach/Bowel: Stomach decompressed. Small bowel is nondistended. Normal appendix. Colon is nondilated. There are mild edematous/inflammatory changes around the descending colon. No significant diverticular disease. No abscess. Vascular/Lymphatic: Calcified aortoiliac plaque without aneurysm. No abdominal or pelvic adenopathy. Reproductive: Status post hysterectomy. No adnexal masses. Other:  No ascites.  No free air. Musculoskeletal: Multilevel lumbar spondylitic change. No fracture or worrisome bone lesion. IMPRESSION: 1. Mild inflammatory/edematous changes around the descending colon suggesting colitis. No evidence of perforation or abscess. 2. Coronary and aortoiliac  atherosclerosis (ICD10-170.0). Electronically Signed   By: Corlis Leak M.D.   On: 01/07/2021 08:28      Impression / Plan:   Joyann Spidle is a 80 y.o. y/o female presents to the ER with acute onset nausea vomiting abdominal pain and diarrhea bloody in nature.  CT scan of the abdomen showed features suggestive of colitis.  History and imaging are suggestive of an acute gastroenteritis/dysentery.   Plan 1.  Continue supportive therapy with IV fluids and antiemetics usually should resolve in 24 to 48 hours.Can commence on clears  2.  If having diarrhea check stool for C. difficile and  GI PCR. 3. Continue empiric antibiotics to treat presumed infectious colitis in view of age and presentation .  Thank you for involving me in the care of this patient.      LOS: 0 days   [name], MD  01/07/2021, 1:28 PM

## 2021-01-07 NOTE — ED Notes (Signed)
Patient taken to CT.

## 2021-01-07 NOTE — ED Triage Notes (Signed)
Pt in from home via [facility] after multiple episodes of bright red bloody stool. Pt states she had n/v/d last night after dinner, reports emesis and diarrhea x 3, and 10 episodes of BRB stool. Takes Eliquis, but did not take last night's dose after bloody stools. Denies dizziness, cp or sob. Reports some low generalized abdominal pain. VSS in triage

## 2021-01-08 DIAGNOSIS — A419 Sepsis, unspecified organism: Secondary | ICD-10-CM

## 2021-01-08 DIAGNOSIS — K921 Melena: Secondary | ICD-10-CM | POA: Diagnosis not present

## 2021-01-08 DIAGNOSIS — K529 Noninfective gastroenteritis and colitis, unspecified: Secondary | ICD-10-CM | POA: Diagnosis present

## 2021-01-08 LAB — BASIC METABOLIC PANEL
Anion gap: 3 — ABNORMAL LOW (ref 5–15)
BUN: 20 mg/dL (ref 8–23)
CO2: 23 mmol/L (ref 22–32)
Calcium: 8.4 mg/dL — ABNORMAL LOW (ref 8.9–10.3)
Chloride: 113 mmol/L — ABNORMAL HIGH (ref 98–111)
Creatinine, Ser: 1.17 mg/dL — ABNORMAL HIGH (ref 0.44–1.00)
GFR, Estimated: 47 mL/min — ABNORMAL LOW (ref >60)
Glucose, Bld: 97 mg/dL (ref 70–99)
Potassium: 4.2 mmol/L (ref 3.5–5.1)
Sodium: 139 mmol/L (ref 135–145)

## 2021-01-08 LAB — CBC
HCT: 32.8 % — ABNORMAL LOW (ref 36.0–46.0)
HCT: 36 % (ref 36.0–46.0)
Hemoglobin: 11.1 g/dL — ABNORMAL LOW (ref 12.0–15.0)
Hemoglobin: 12.2 g/dL (ref 12.0–15.0)
MCH: 33 pg (ref 26.0–34.0)
MCH: 33.2 pg (ref 26.0–34.0)
MCHC: 33.8 g/dL (ref 30.0–36.0)
MCHC: 33.9 g/dL (ref 30.0–36.0)
MCV: 97.6 fL (ref 80.0–100.0)
MCV: 98.1 fL (ref 80.0–100.0)
Platelets: 211 10*3/uL (ref 150–400)
Platelets: 229 10*3/uL (ref 150–400)
RBC: 3.36 MIL/uL — ABNORMAL LOW (ref 3.87–5.11)
RBC: 3.67 MIL/uL — ABNORMAL LOW (ref 3.87–5.11)
RDW: 13.2 % (ref 11.5–15.5)
RDW: 13.2 % (ref 11.5–15.5)
WBC: 10.2 10*3/uL (ref 4.0–10.5)
WBC: 8.3 10*3/uL (ref 4.0–10.5)
nRBC: 0 % (ref 0.0–0.2)
nRBC: 0 % (ref 0.0–0.2)

## 2021-01-08 LAB — GLUCOSE, CAPILLARY: Glucose-Capillary: 100 mg/dL — ABNORMAL HIGH (ref 70–99)

## 2021-01-08 MED ORDER — AMOXICILLIN-POT CLAVULANATE 875-125 MG PO TABS: 1.0000 | ORAL_TABLET | Freq: Two times a day (BID) | ORAL | 0 refills | Status: AC

## 2021-01-08 NOTE — Discharge Summary (Addendum)
Physician Discharge Summary  Patient ID: Raven Cox MRN: [id number] DOB/AGE: 12-23-1940 80 y.o.  Admit date: 01/07/2021 Discharge date: 01/08/2021  Admission Diagnoses:  Discharge Diagnoses:  Principal Problem:   Acute colitis Active Problems:   Sepsis (HCC)   Afib (HCC)   Hypertension   CKD (chronic kidney disease), stage IIIb   HLD (hyperlipidemia)   CAD (coronary artery disease)   Colitis Permanent atrial fibrillation  Discharged Condition: good  Hospital Course:  Raven Cox is a 80 y.o. female with medical history significant of hypertension, hyperlipidemia, atrial fibrillation on Eliquis, CKD stage IIIa, CAD, stent placement, who presents with rectal bleeding, nausea, vomiting, abdominal pain.  CT abdomen/pelvis showed colitis.  She was seen by GI, was started on Zosyn.  Her Eliquis was on hold. Condition has improved today, discussed with GI again, patient symptom has totally resolved.  Due to patient high risk of stroke with atrial fibrillation, will restart Eliquis.  Patient be treated with Augmentin for next 6 days. Currently, patient is medically stable to be discharged. Blood cultures so far had no growth.   Consults: GI  Significant Diagnostic Studies:  CT ABDOMEN AND PELVIS WITHOUT CONTRAST   TECHNIQUE: Multidetector CT imaging of the abdomen and pelvis was performed following the standard protocol without IV contrast.   COMPARISON:  None.   FINDINGS: Lower chest: Coronary calcifications. No pleural or pericardial effusion.   Hepatobiliary: No focal liver abnormality is seen. No gallstones, gallbladder wall thickening, or biliary dilatation.   Pancreas: Unremarkable. No pancreatic ductal dilatation or surrounding inflammatory changes.   Spleen: Normal in size without focal abnormality.   Adrenals/Urinary Tract: Adrenal glands are unremarkable. Kidneys are normal, without renal calculi, focal lesion, or hydronephrosis. Bladder is  unremarkable.   Stomach/Bowel: Stomach decompressed. Small bowel is nondistended. Normal appendix. Colon is nondilated. There are mild edematous/inflammatory changes around the descending colon. No significant diverticular disease. No abscess.   Vascular/Lymphatic: Calcified aortoiliac plaque without aneurysm. No abdominal or pelvic adenopathy.   Reproductive: Status post hysterectomy. No adnexal masses.   Other: No ascites.  No free air.   Musculoskeletal: Multilevel lumbar spondylitic change. No fracture or worrisome bone lesion.   IMPRESSION: 1. Mild inflammatory/edematous changes around the descending colon suggesting colitis. No evidence of perforation or abscess. 2. Coronary and aortoiliac  atherosclerosis (ICD10-170.0).     Electronically Signed   By: Corlis Leak M.D.   On: 01/07/2021 08:28  Treatments: zosyn  Discharge Exam: Blood pressure (!) 133/56, pulse 62, temperature 97.7 F (36.5 C), temperature source Oral, resp. rate 20, height 5' 5.98" (1.676 m), weight 95.3 kg, SpO2 96 %. General appearance: alert and cooperative Resp: clear to auscultation bilaterally Cardio: irregularly irregular rhythm and no murmurs GI: soft, non-tender; bowel sounds normal; no masses,  no organomegaly Extremities: extremities normal, atraumatic, no cyanosis or edema  Disposition: Discharge disposition: 01-Home or Self Care       Discharge Instructions     Diet general   Complete by: As directed    Soft diet for 3 days, then resume regular diet   Increase activity slowly   Complete by: As directed       Allergies as of 01/08/2021   Not on File      Medication List     TAKE these medications    acidophilus Caps capsule Take 1 capsule by mouth daily.   amoxicillin-clavulanate 875-125 MG tablet Commonly known as: Augmentin Take 1 tablet by mouth 2 (two) times daily for 6 days.  apixaban 5 MG Tabs tablet Commonly known as: ELIQUIS Take 5 mg by mouth 2 (two)  times daily.   atorvastatin 20 MG tablet Commonly known as: LIPITOR Take 20 mg by mouth daily.   cholecalciferol 25 MCG (1000 UNIT) tablet Commonly known as: VITAMIN D3 Take 1,000 Units by mouth daily.   clopidogrel 75 MG tablet Commonly known as: PLAVIX Take 75 mg by mouth daily.   diltiazem 180 MG 24 hr capsule Commonly known as: CARDIZEM CD Take 360 mg by mouth daily.   lisinopril 20 MG tablet Commonly known as: ZESTRIL Take 20 mg by mouth every evening.   metoprolol succinate 25 MG 24 hr tablet Commonly known as: TOPROL-XL Take 25 mg by mouth every evening.         Signed: Marrion Coy 01/08/2021, 10:26 AM

## 2021-01-08 NOTE — Progress Notes (Addendum)
Melodie Bouillon, MD [address], [place], [place], [address] [address], [place], [place], [address] Phone: [phone]  Fax: [phone]   Subjective: Patient denies any abdominal pain.  No nausea or vomiting.  Denies any further blood in stool.  Has not had a bowel movement today or yesterday   Objective: Exam: Vital signs in last 24 hours: Vitals:   01/07/21 1459 01/07/21 1936 01/08/21 0358 01/08/21 0800  BP: (!) 136/51 (!) 108/55 130/66 (!) 133/56  Pulse: 63 71 66 62  Resp: 18 20 20    Temp: 98.1 F (36.7 C) 98.4 F (36.9 C) 97.6 F (36.4 C) 97.7 F (36.5 C)  TempSrc: Oral Oral Oral Oral  SpO2: 97% 98% 98% 96%  Weight:      Height:       Weight change:   Intake/Output Summary (Last 24 hours) at 01/08/2021 1012 Last data filed at 01/07/2021 2038 Gross per 24 hour  Intake 254.21 ml  Output 0 ml  Net 254.21 ml    Constitutional: General:   Alert,  Well-developed, well-nourished, pleasant and cooperative in NAD BP (!) 133/56 (BP Location: Left Arm)   Pulse 62   Temp 97.7 F (36.5 C) (Oral)   Resp 20   Ht 5' 5.98" (1.676 m)   Wt 95.3 kg   SpO2 96%   BMI 33.91 kg/m   Eyes:  Sclera clear, no icterus.   Conjunctiva pink.   Ears:  No scars, lesions or masses, Normal auditory acuity. Nose:  No deformity, discharge, or lesions. Mouth:  No deformity or lesions, oropharynx pink & moist.  Neck:  Supple; no masses, trachea midline  Respiratory: Normal respiratory effort  Gastrointestinal:  Soft, non-tender and non-distended without masses, hepatosplenomegaly or hernias noted.  No guarding or rebound tenderness.     Cardiac: No clubbing or edema.  No cyanosis. Normal posterior tibial pedal pulses noted.  Lymphatic:  No significant cervical adenopathy.  Psych:  Alert and cooperative. Normal mood and affect.  Musculoskeletal:  Head normocephalic, atraumatic. 5/5 Lower extremity strength bilaterally.  Skin: Warm. Intact without  significant lesions or rashes. No jaundice.  Neurologic:  Face symmetrical, tongue midline, Normal sensation to touch  Psych:  Alert and oriented x3, Alert and cooperative. Normal mood and affect.   Lab Results: Lab Results  Component Value Date   WBC 8.3 01/08/2021   HGB 12.2 01/08/2021   HCT 36.0 01/08/2021   MCV 98.1 01/08/2021   PLT 229 01/08/2021   Micro Results: Recent Results (from the past 240 hour(s))  Blood culture (single)     Status: None (Preliminary result)   Collection Time: 01/07/21  8:32 AM   Specimen: BLOOD  Result Value Ref Range Status   Specimen Description BLOOD BLOOD LEFT WRIST  Final   Special Requests   Final    BOTTLES DRAWN AEROBIC AND ANAEROBIC Blood Culture adequate volume   Culture   Final    NO GROWTH < 24 HOURS Performed at [facility], [address], [place], [place]    Report Status PENDING  Incomplete  Resp Panel by RT-PCR (Flu A&B, Covid) Nasopharyngeal Swab     Status: None   Collection Time: 01/07/21  8:32 AM   Specimen: Nasopharyngeal Swab; Nasopharyngeal(NP) swabs in vial transport medium  Result Value Ref Range Status   SARS Coronavirus 2 by RT PCR NEGATIVE NEGATIVE Final    Comment: (NOTE) SARS-CoV-2 target nucleic acids are NOT DETECTED.  The SARS-CoV-2 RNA is  generally detectable in upper respiratory specimens during the acute phase of infection. The lowest concentration of SARS-CoV-2 viral copies this assay can detect is 138 copies/mL. A negative result does not preclude SARS-Cov-2 infection and should not be used as the sole basis for treatment or other patient management decisions. A negative result may occur with  improper specimen collection/handling, submission of specimen other than nasopharyngeal swab, presence of viral mutation(s) within the areas targeted by this assay, and inadequate number of viral copies(<138 copies/mL). A negative result must be combined with clinical observations, patient  history, and epidemiological information. The expected result is Negative.  Fact Sheet for Patients:  BloggerCourse.com  Fact Sheet for Healthcare Providers:  SeriousBroker.it  This test is no t yet approved or cleared by the [place] FDA and  has been authorized for detection and/or diagnosis of SARS-CoV-2 by FDA under an Emergency Use Authorization (EUA). This EUA will remain  in effect (meaning this test can be used) for the duration of the COVID-19 declaration under Section 564(b)(1) of the Act, 21 U.S.C.section 360bbb-3(b)(1), unless the authorization is terminated  or revoked sooner.       Influenza A by PCR NEGATIVE NEGATIVE Final   Influenza B by PCR NEGATIVE NEGATIVE Final    Comment: (NOTE) The Xpert Xpress SARS-CoV-2/FLU/RSV plus assay is intended as an aid in the diagnosis of influenza from Nasopharyngeal swab specimens and should not be used as a sole basis for treatment. Nasal washings and aspirates are unacceptable for Xpert Xpress SARS-CoV-2/FLU/RSV testing.  Fact Sheet for Patients: BloggerCourse.com  Fact Sheet for Healthcare Providers: SeriousBroker.it  This test is not yet approved or cleared by the [place] FDA and has been authorized for detection and/or diagnosis of SARS-CoV-2 by FDA under an Emergency Use Authorization (EUA). This EUA will remain in effect (meaning this test can be used) for the duration of the COVID-19 declaration under Section 564(b)(1) of the Act, 21 U.S.C. section 360bbb-3(b)(1), unless the authorization is terminated or revoked.  Performed at [facility], [address] [place], [place] [address]   Blood culture (single)     Status: None (Preliminary result)   Collection Time: 01/07/21  9:41 AM   Specimen: BLOOD  Result Value Ref Range Status   Specimen Description BLOOD  LEFT AC  Final   Special  Requests   Final    BOTTLES DRAWN AEROBIC AND ANAEROBIC Blood Culture adequate volume   Culture   Final    NO GROWTH < 24 HOURS Performed at [facility], [address] [place], [place] [address]    Report Status PENDING  Incomplete   Studies/Results: CT ABDOMEN PELVIS WO CONTRAST  Result Date: 01/07/2021 CLINICAL DATA:  Abdominal distension, right red blood per rectum, on Eliquis EXAM: CT ABDOMEN AND PELVIS WITHOUT CONTRAST TECHNIQUE: Multidetector CT imaging of the abdomen and pelvis was performed following the standard protocol without IV contrast. COMPARISON:  None. FINDINGS: Lower chest: Coronary calcifications. No pleural or pericardial effusion. Hepatobiliary: No focal liver abnormality is seen. No gallstones, gallbladder wall thickening, or biliary dilatation. Pancreas: Unremarkable. No pancreatic ductal dilatation or surrounding inflammatory changes. Spleen: Normal in size without focal abnormality. Adrenals/Urinary Tract: Adrenal glands are unremarkable. Kidneys are normal, without renal calculi, focal lesion, or hydronephrosis. Bladder is unremarkable. Stomach/Bowel: Stomach decompressed. Small bowel is nondistended. Normal appendix. Colon is nondilated. There are mild edematous/inflammatory changes around the descending colon. No significant diverticular disease. No abscess. Vascular/Lymphatic: Calcified aortoiliac plaque without aneurysm. No abdominal or pelvic  adenopathy. Reproductive: Status post hysterectomy. No adnexal masses. Other: No ascites.  No free air. Musculoskeletal: Multilevel lumbar spondylitic change. No fracture or worrisome bone lesion. IMPRESSION: 1. Mild inflammatory/edematous changes around the descending colon suggesting colitis. No evidence of perforation or abscess. 2. Coronary and aortoiliac  atherosclerosis (ICD10-170.0). Electronically Signed   By: Corlis Leak M.D.   On: 01/07/2021 08:28   Medications:  Scheduled Meds:  acidophilus  1 capsule Oral  Daily   atorvastatin  20 mg Oral Daily   cholecalciferol  1,000 Units Oral Daily   diltiazem  360 mg Oral Daily   metoprolol succinate  25 mg Oral QPM   Continuous Infusions:  sodium chloride 75 mL/hr at 01/08/21 0857   piperacillin-tazobactam (ZOSYN)  IV 3.375 g (01/08/21 0514)   PRN Meds:.acetaminophen, hydrALAZINE, morphine injection, ondansetron (ZOFRAN) IV   Assessment: Principal Problem:   Acute colitis Active Problems:   Sepsis (HCC)   Afib (HCC)   Hypertension   CKD (chronic kidney disease), stage IIIb   HLD (hyperlipidemia)   CAD (coronary artery disease)    Plan: Patient states her symptoms started as 1 episode of nausea vomiting, and then 1 large loose bowel movement, followed by frank hematochezia which led her to come to the hospital.  She states all of the symptoms have completely resolved since then.  She is not having any further diarrhea.  No further blood in stool, or hematochezia  Therefore, clinically symptoms have resolved with current treatment, and no indication for further interventions or endoscopy at this time and likely etiology is infectious gastroenteritis  If patient does have diarrhea, would recommend collecting it and sending it for C. difficile and infectious testing  Okay to advance diet as tolerated as patient tolerated clear liquid diet well yesterday  No fever or chills or other alarm symptoms present at this time  Patient states she lives in [place], and once she is discharged, would recommend close follow-up with primary care provider and a local GI provider for follow-up of symptoms and need for any further testing as necessary at that time  Given that hematochezia has resolved, would recommend weighing and discussing risks and benefits of resuming Eliquis with the patient   If Eliquis is restarted, can consider monitoring patient closely to assess for any evidence of recurrent bleeding  Above discussed with primary team Dr. Chipper Herb,  over the phone, and as per him patient's CHADs score is 5. she has not had any further bleeding in 24 hrs. Therefore based on this they plan on restarting eliquis which is reasonable. Pt is from [place], and would recommend close follow up as an outpatient as well once she is discharged.    LOS: 1 day   Melodie Bouillon, MD 01/08/2021, 10:12 AM

## 2021-01-08 NOTE — Progress Notes (Signed)
Patient given instructions to follow up with PCP in [place], when to return for worsening symptoms, IV taken out, tele discontinued, Colitis education given, Diet education, & discharged via wheelchair.

## 2021-01-08 NOTE — Care Management Obs Status (Signed)
MEDICARE OBSERVATION STATUS NOTIFICATION   Patient Details  Name: Raven Cox MRN: [id number] Date of Birth: 08/03/1940   Medicare Observation Status Notification Given:  Yes    Luvenia Redden, RN 01/08/2021, 10:48 AM

## 2021-01-08 NOTE — Care Management CC44 (Signed)
Condition Code 44 Documentation Completed  Patient Details  Name: Colandra Ohanian MRN: [id number] Date of Birth: 11/26/1940   Condition Code 44 given:  Yes Patient signature on Condition Code 44 notice:  Yes Documentation of 2 MD's agreement:  Yes Code 44 added to claim:  Yes    Luvenia Redden, RN 01/08/2021, 10:49 AM

## 2021-01-12 LAB — CULTURE, BLOOD (SINGLE)
Culture: NO GROWTH
Culture: NO GROWTH
Special Requests: ADEQUATE
Special Requests: ADEQUATE

## 2022-12-14 IMAGING — CT CT ABD-PELV W/O CM
2 of 4 series · 17 of 46 positions shown, 19 images · non-contrast
Comparison: None.

CLINICAL DATA: Abdominal distension, right red blood per rectum, on
Eliquis

EXAM:
CT ABDOMEN AND PELVIS WITHOUT CONTRAST
TECHNIQUE: Multidetector CT imaging of the abdomen and pelvis was performed
following the standard protocol without IV contrast.

[Series 2: routine abd/pel wo · axial · 0.86mm/px · z∈[-984,-524]mm · 14 of 102 slices shown, 16 images]
[im 5/102  soft-tissue]
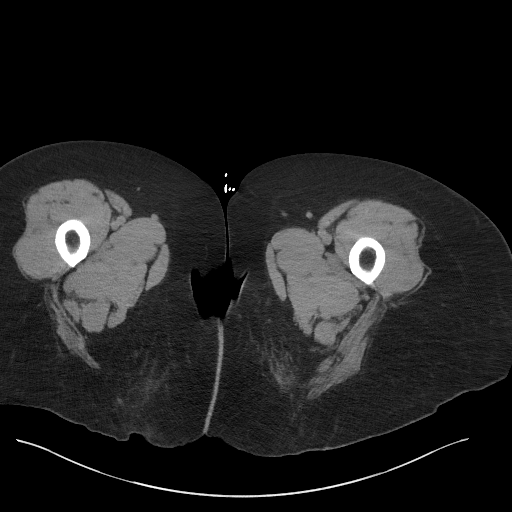
[im 5/102  bone]
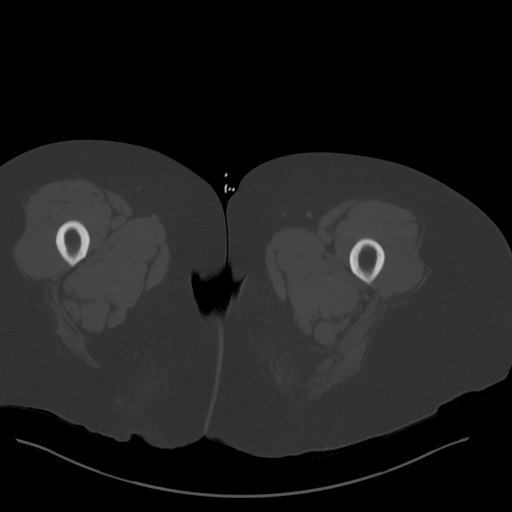
[im 13/102  soft-tissue]
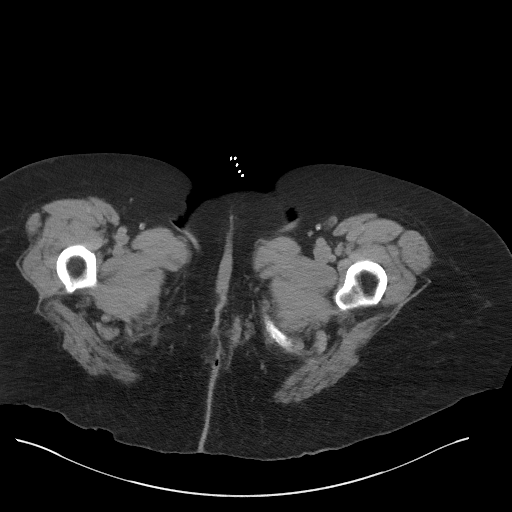
[im 21/102  soft-tissue]
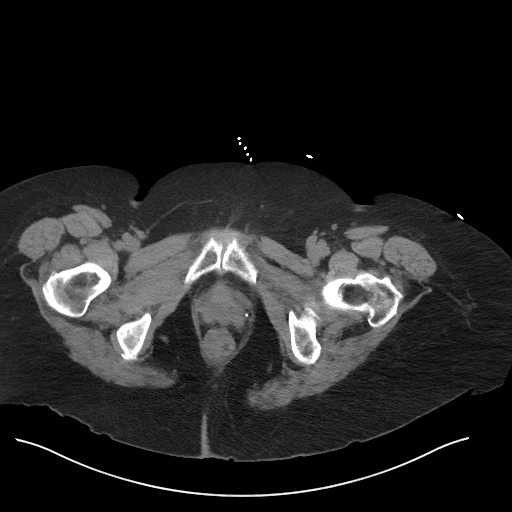
[im 29/102  soft-tissue]
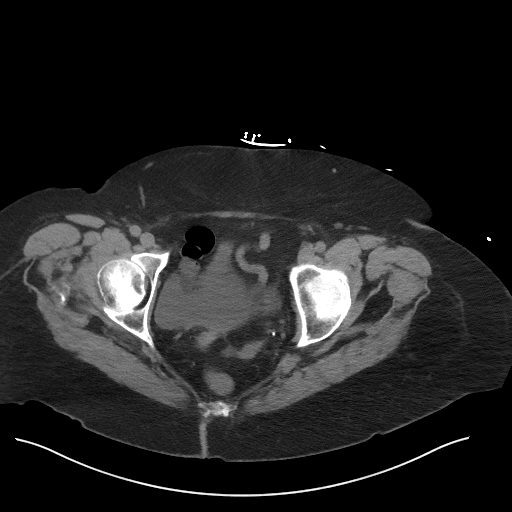
[im 33/102  soft-tissue]
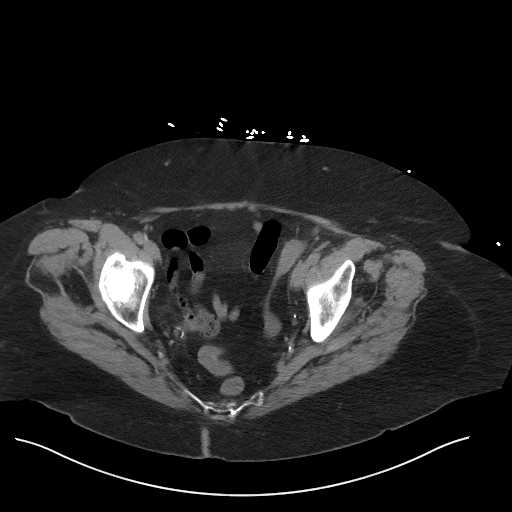
[im 41/102  soft-tissue]
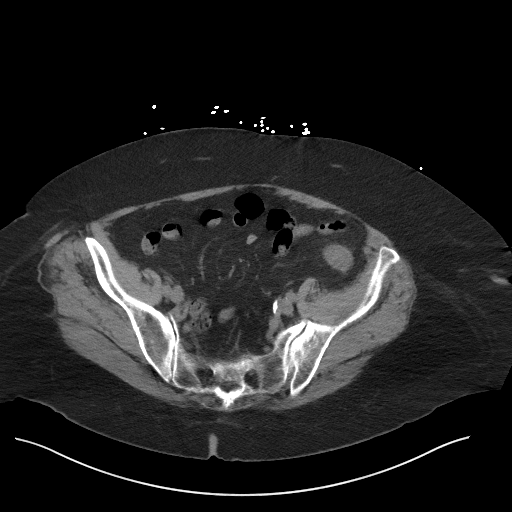
[im 49/102  soft-tissue]
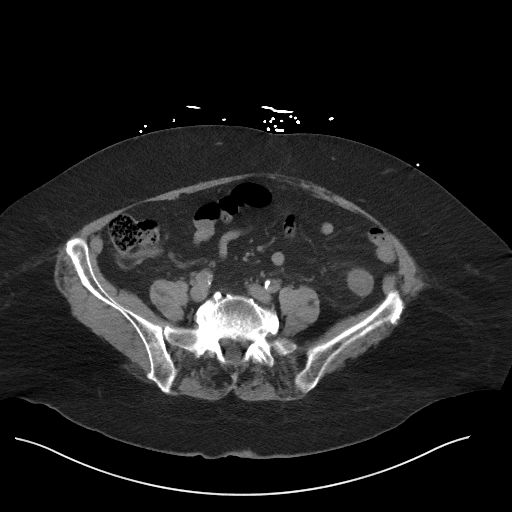
[im 53/102  soft-tissue]
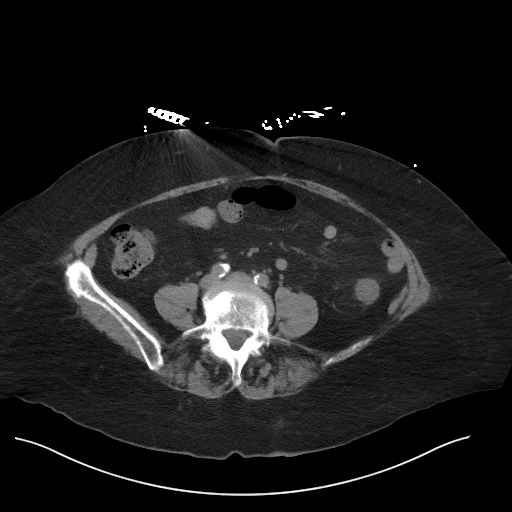
[im 61/102  soft-tissue]
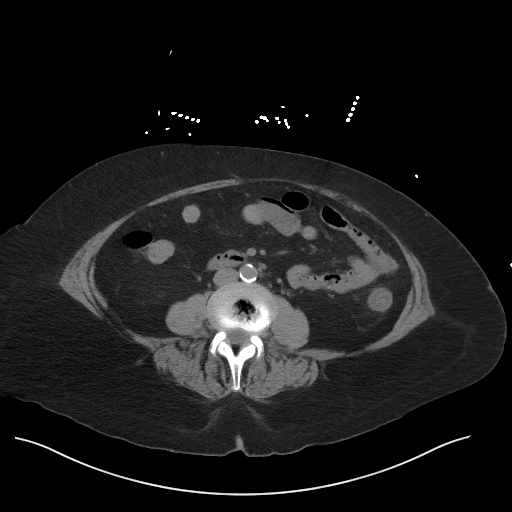
[im 61/102  bone]
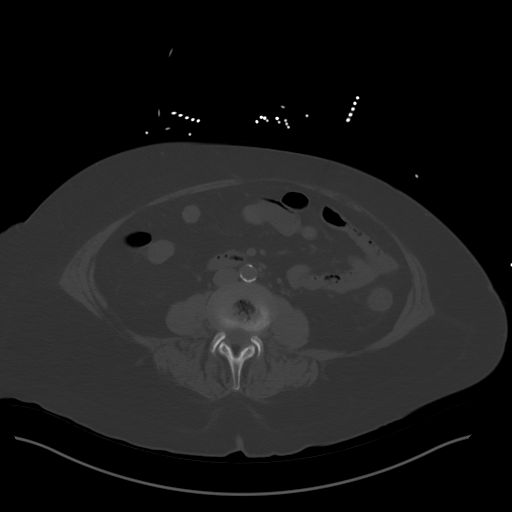
[im 69/102  soft-tissue]
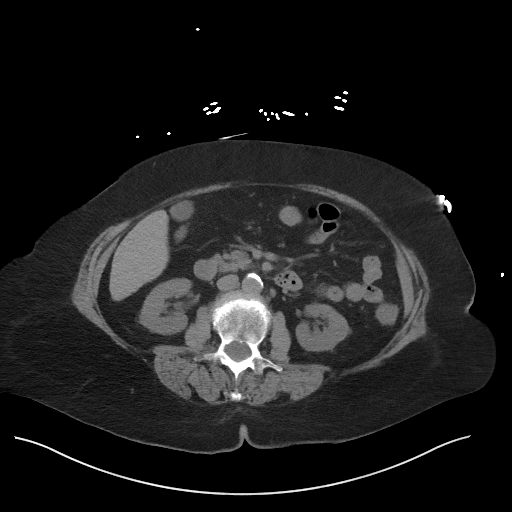
[im 77/102  soft-tissue]
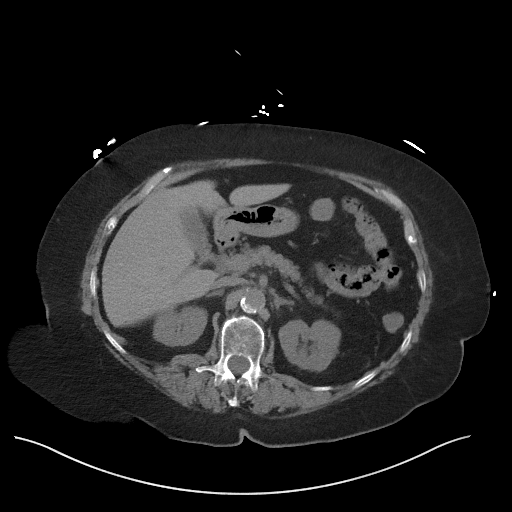
[im 81/102  soft-tissue]
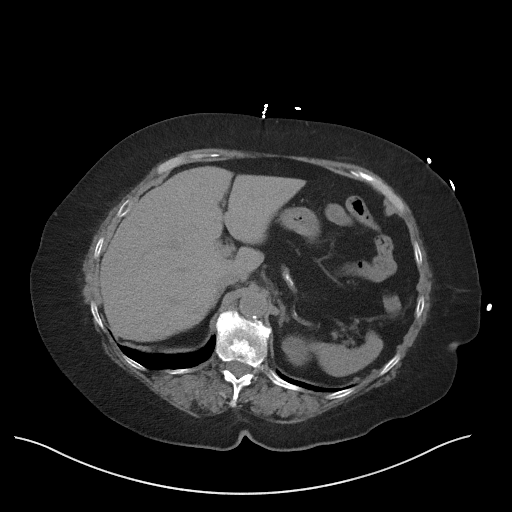
[im 89/102  soft-tissue]
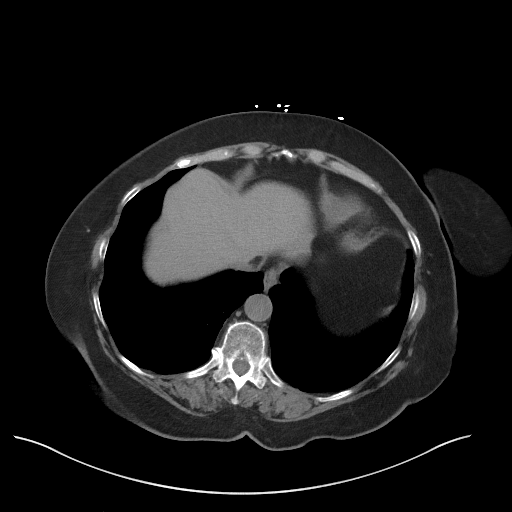
[im 97/102  soft-tissue]
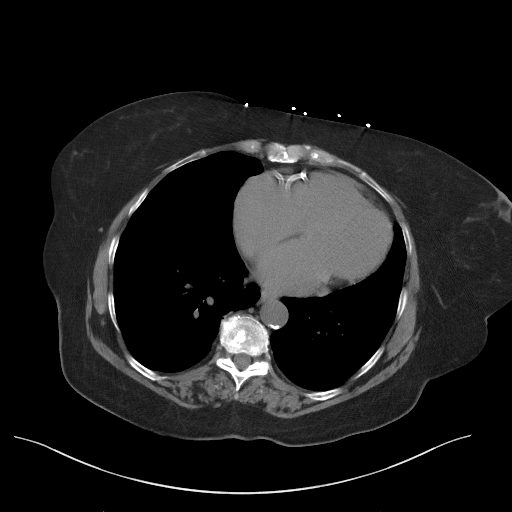

[Series 5: coronal st · coronal · 0.99mm/px · 3 of 107 slices shown]
[im 36/107  soft-tissue]
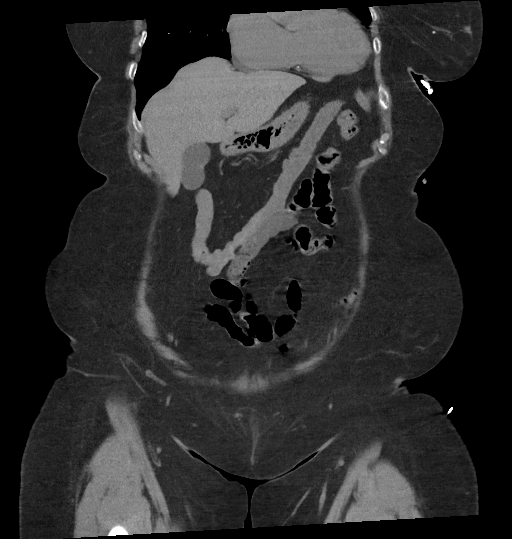
[im 48/107  soft-tissue]
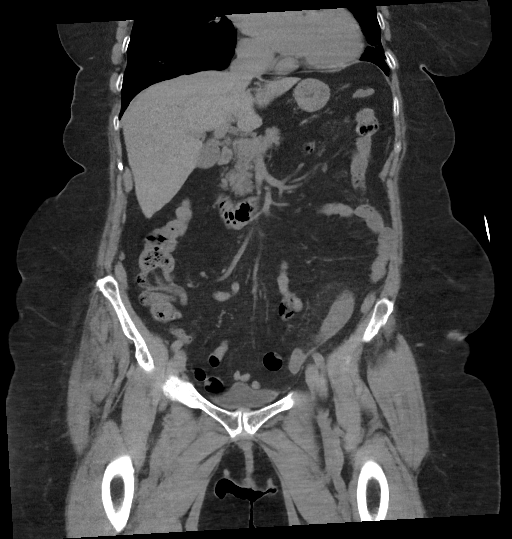
[im 59/107  soft-tissue]
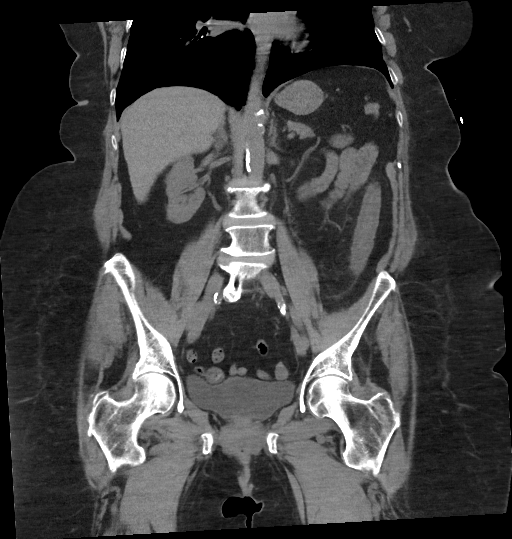

[17 of 46 positions shown; findings below may reference images not displayed]

FINDINGS: Lower chest: Coronary calcifications. No pleural or pericardial
effusion.

Hepatobiliary: No focal liver abnormality is seen. No gallstones,
gallbladder wall thickening, or biliary dilatation.

Pancreas: Unremarkable. No pancreatic ductal dilatation or
surrounding inflammatory changes.

Spleen: Normal in size without focal abnormality.

Adrenals/Urinary Tract: Adrenal glands are unremarkable. Kidneys are
normal, without renal calculi, focal lesion, or hydronephrosis.
Bladder is unremarkable.

Stomach/Bowel: Stomach decompressed. Small bowel is nondistended.
Normal appendix. Colon is nondilated. There are mild
edematous/inflammatory changes around the descending colon. No
significant diverticular disease. No abscess.

Vascular/Lymphatic: Calcified aortoiliac plaque without aneurysm. No
abdominal or pelvic adenopathy.

Reproductive: Status post hysterectomy. No adnexal masses.

Other: No ascites.  No free air.

Musculoskeletal: Multilevel lumbar spondylitic change. No fracture
or worrisome bone lesion.
IMPRESSION: 1. Mild inflammatory/edematous changes around the descending colon
suggesting colitis. No evidence of perforation or abscess.
2. Coronary and aortoiliac  atherosclerosis ([id number]).
